# Patient Record
Sex: Female | Born: 1935 | Race: White | Hispanic: No | Marital: Single | Smoking: Never smoker
Health system: Southern US, Community
[De-identification: ages and names within clinical notes are randomized; demographics above are authoritative.]

## PROBLEM LIST (undated history)

## (undated) DIAGNOSIS — H409 Unspecified glaucoma: Secondary | ICD-10-CM

## (undated) DIAGNOSIS — F0390 Unspecified dementia without behavioral disturbance: Secondary | ICD-10-CM

## (undated) DIAGNOSIS — K219 Gastro-esophageal reflux disease without esophagitis: Secondary | ICD-10-CM

## (undated) DIAGNOSIS — H9190 Unspecified hearing loss, unspecified ear: Secondary | ICD-10-CM

## (undated) DIAGNOSIS — I2510 Atherosclerotic heart disease of native coronary artery without angina pectoris: Secondary | ICD-10-CM

## (undated) HISTORY — PX: PACEMAKER PLACEMENT: SHX43

---

## 2015-01-20 ENCOUNTER — Emergency Department (HOSPITAL_COMMUNITY)
Admission: EM | Admit: 2015-01-20 | Discharge: 2015-01-20 | Disposition: A | Discharge status: Home / Self Care | Payer: Medicare Other | Attending: Emergency Medicine | Admitting: Emergency Medicine

## 2015-01-20 ENCOUNTER — Encounter (HOSPITAL_COMMUNITY): Payer: Self-pay | Admitting: *Deleted

## 2015-01-20 ENCOUNTER — Emergency Department (HOSPITAL_COMMUNITY): Payer: Medicare Other

## 2015-01-20 DIAGNOSIS — W1839XA Other fall on same level, initial encounter: Secondary | ICD-10-CM | POA: Insufficient documentation

## 2015-01-20 DIAGNOSIS — Y998 Other external cause status: Secondary | ICD-10-CM | POA: Diagnosis not present

## 2015-01-20 DIAGNOSIS — Y9389 Activity, other specified: Secondary | ICD-10-CM | POA: Diagnosis not present

## 2015-01-20 DIAGNOSIS — W19XXXA Unspecified fall, initial encounter: Secondary | ICD-10-CM

## 2015-01-20 DIAGNOSIS — S29001A Unspecified injury of muscle and tendon of front wall of thorax, initial encounter: Secondary | ICD-10-CM | POA: Diagnosis not present

## 2015-01-20 DIAGNOSIS — F0390 Unspecified dementia without behavioral disturbance: Secondary | ICD-10-CM | POA: Insufficient documentation

## 2015-01-20 DIAGNOSIS — Y9289 Other specified places as the place of occurrence of the external cause: Secondary | ICD-10-CM | POA: Diagnosis not present

## 2015-01-20 DIAGNOSIS — S42002A Fracture of unspecified part of left clavicle, initial encounter for closed fracture: Secondary | ICD-10-CM

## 2015-01-20 DIAGNOSIS — I2510 Atherosclerotic heart disease of native coronary artery without angina pectoris: Secondary | ICD-10-CM | POA: Diagnosis not present

## 2015-01-20 DIAGNOSIS — H409 Unspecified glaucoma: Secondary | ICD-10-CM | POA: Insufficient documentation

## 2015-01-20 DIAGNOSIS — Z8719 Personal history of other diseases of the digestive system: Secondary | ICD-10-CM | POA: Diagnosis not present

## 2015-01-20 DIAGNOSIS — R0781 Pleurodynia: Secondary | ICD-10-CM

## 2015-01-20 DIAGNOSIS — S42032A Displaced fracture of lateral end of left clavicle, initial encounter for closed fracture: Secondary | ICD-10-CM | POA: Insufficient documentation

## 2015-01-20 HISTORY — DX: Unspecified hearing loss, unspecified ear: H91.90

## 2015-01-20 HISTORY — DX: Unspecified glaucoma: H40.9

## 2015-01-20 HISTORY — DX: Unspecified dementia, unspecified severity, without behavioral disturbance, psychotic disturbance, mood disturbance, and anxiety: F03.90

## 2015-01-20 HISTORY — DX: Atherosclerotic heart disease of native coronary artery without angina pectoris: I25.10

## 2015-01-20 HISTORY — DX: Gastro-esophageal reflux disease without esophagitis: K21.9

## 2015-01-20 MED ORDER — ACETAMINOPHEN 500 MG PO TABS
1000.0000 mg | ORAL_TABLET | Freq: Once | ORAL | Status: AC
  Administered 2015-01-20: 1000 mg via ORAL
  Filled 2015-01-20: qty 2

## 2015-01-20 NOTE — ED Provider Notes (Addendum)
CSN: [id number]     Arrival date & time 01/20/15  1759 History   First MD Initiated Contact with Patient 01/20/15 2057     Chief Complaint  Patient presents with  . Fall     (Consider location/radiation/quality/duration/timing/severity/associated sxs/prior Treatment) HPI Patient presents with a family member who provide history of present illness. Patient has a history of dementia, level V caveat. Patient minimally follows commands, is essentially nonverbal. Family member states patient unwitnessed fall today, seems to indicate associated has pain in her left lateral rib cage. Patient has frequent falls. Family member states the patient is otherwise interacting in a typical manner for her.  Past Medical History  Diagnosis Date  . Dementia   . Glaucoma   . Acid reflux   . Coronary artery disease   . HOH (hard of hearing)    Past Surgical History  Procedure Laterality Date  . Pacemaker placement     History reviewed. No pertinent family history. Social History  Substance Use Topics  . Smoking status: Never Smoker   . Smokeless tobacco: None  . Alcohol Use: No   OB History    No data available     Review of Systems  Unable to perform ROS: Dementia      Allergies  Sulfa antibiotics  Home Medications   Prior to Admission medications   Not on File   BP 196/103 mmHg  Pulse 95  Temp(Src) 98.9 F (37.2 C) (Oral)  Resp 21  SpO2 97% Physical Exam  Constitutional: She appears well-developed and well-nourished. No distress.  HENT:  Head: Normocephalic and atraumatic.  Eyes: Conjunctivae and EOM are normal.  Cardiovascular: Normal rate and regular rhythm.   Pulmonary/Chest: Effort normal and breath sounds normal. No stridor. No respiratory distress.      Abdominal: She exhibits no distension.  Musculoskeletal: She exhibits no edema.       Right shoulder: Normal.       Left shoulder: Normal.       Right hip: Normal.       Left hip: Normal.       Right  knee: Normal.       Left knee: Normal.  Neurological: She displays atrophy. She displays no tremor. No cranial nerve deficit. She displays no seizure activity.  Minimally interactive, but does move all extremities spontaneously  Skin: Skin is warm and dry.     Psychiatric: Her speech is delayed. Cognition and memory are impaired.  Nursing note and vitals reviewed.   ED Course  Procedures (including critical care time)  Imaging Review Dg Ribs Unilateral W/chest Right  01/20/2015   CLINICAL DATA:  Fall with right rib pain.  EXAM: RIGHT RIBS AND CHEST - 3+ VIEW  COMPARISON:  06/25/2014  FINDINGS: Upper limits normal heart size and left-sided pacemaker again noted.  Left basilar atelectasis/ scarring noted.  There is no evidence of pleural effusion, pneumothorax, pulmonary edema or mass.  No acute right rib fractures are identified.  A fracture of the distal left clavicle appear subacute to remote but new since 06/25/2014.  Remote left-sided rib fractures are identified.  IMPRESSION: No evidence of acute right rib abnormality.  Distal left clavicle fracture -appears subacute to remote but new since 06/25/2014.  Mild left basilar atelectasis/scarring.   Electronically Signed   By: Harmon Pier M.D.   On: 01/20/2015 22:18   I have personally reviewed and evaluated these images and lab results as part of my medical decision-making.  I demonstrated the images  to the patient's husband, we discussed the subacute clavicle fracture, as well as today's absence of rib fractures.  Husband states patient had a fall approximately 3 weeks ago, and the trace ecchymosis on her left superior lateral shoulder resulted during that episode.  MDM  Elderly female presents after a fall with mild rib pain. Patient is interacting at baseline according to her husband, here is in no distress. No x-ray evidence for acute fracture, though there is demonstration of subacute medical fracture. Patient had Tylenol provided  here, patient's husband will provide some more medication for pain management home, patient discharged in stable condition.  Gerhard Munch, MD 01/20/15 1610  Gerhard Munch, MD 01/20/15 [phone]

## 2015-01-20 NOTE — ED Notes (Signed)
Family reports pt had unwitnessed fall today, was found in bedroom and unsure if she hit her head. No obv injuries noted. Not on thinners. No acute distress noted at triage.

## 2016-12-11 DEATH — deceased

## 2017-02-07 IMAGING — DX DG RIBS W/ CHEST 3+V*R*
5 series · 5 of 5 positions shown · non-contrast
Comparison: 06/25/2014

CLINICAL DATA: Fall with right rib pain.

EXAM:
RIGHT RIBS AND CHEST - 3+ VIEW

[chest ap]
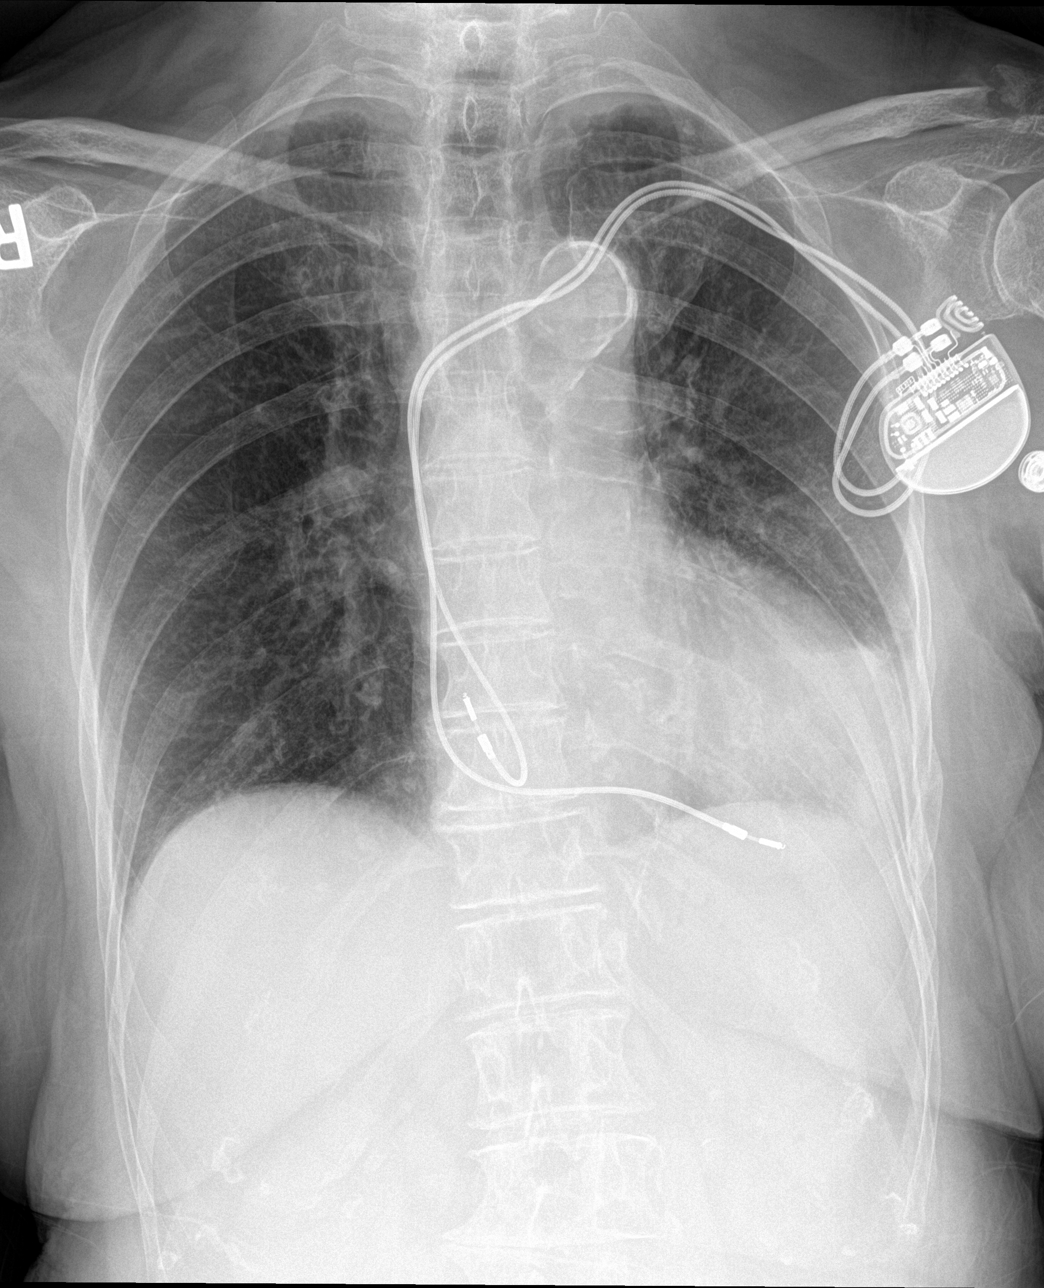

[rib ap (1 of 2)]
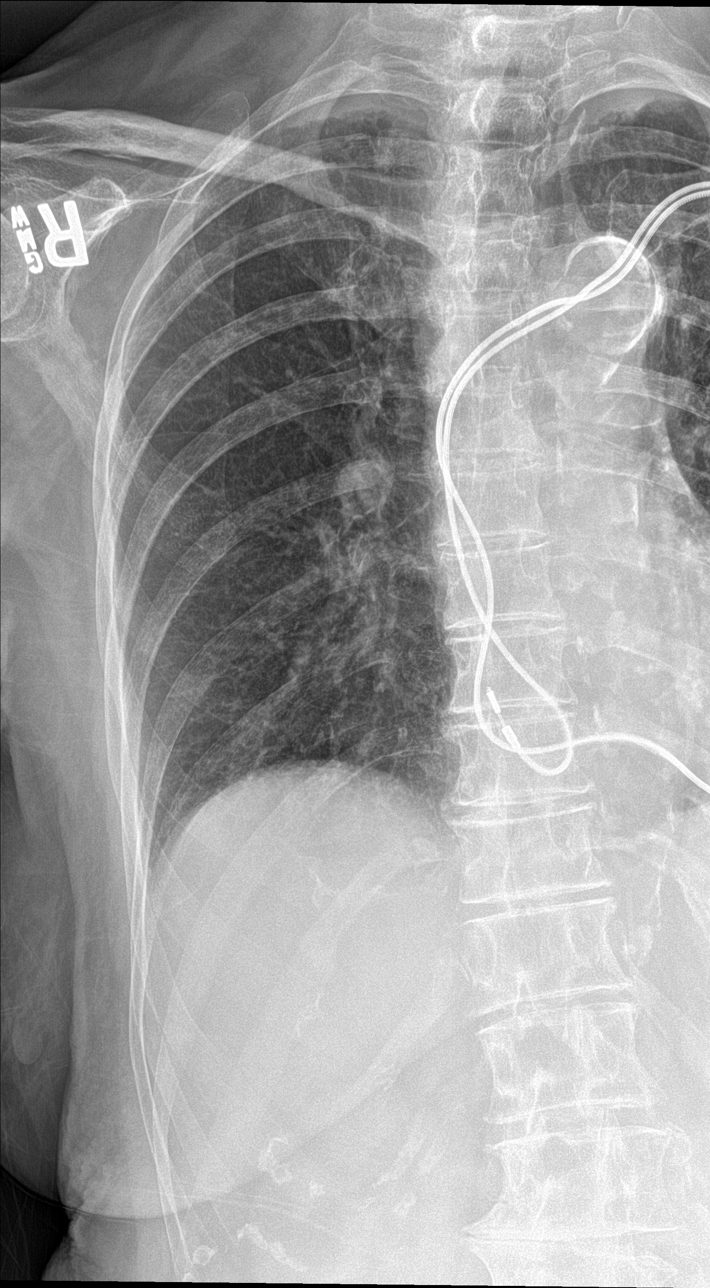

[rib ap obl (1 of 2)]
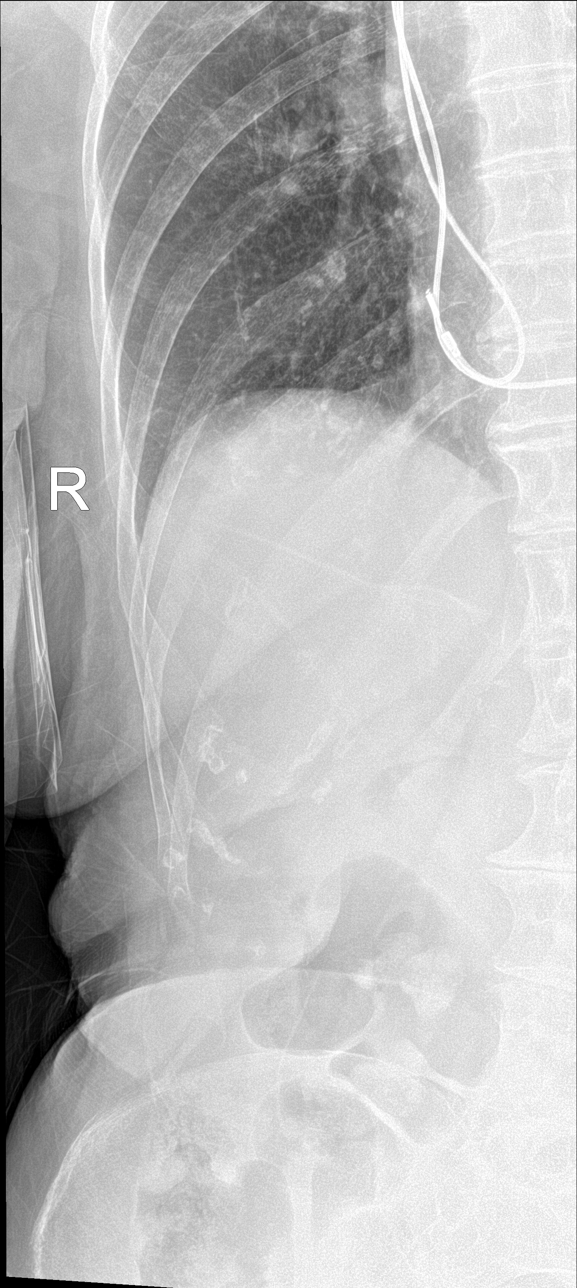

[rib ap (2 of 2)]
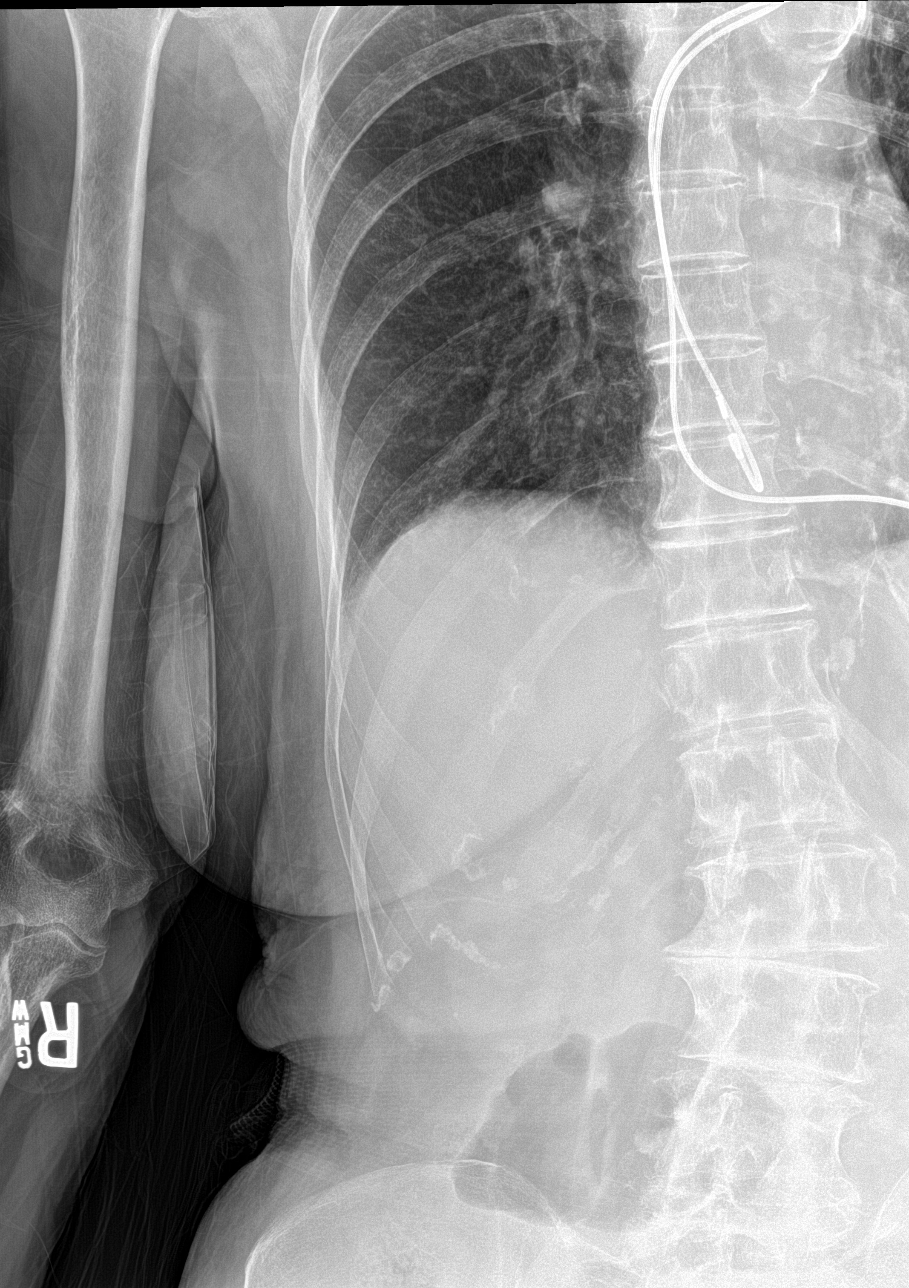

[rib ap obl (2 of 2)]
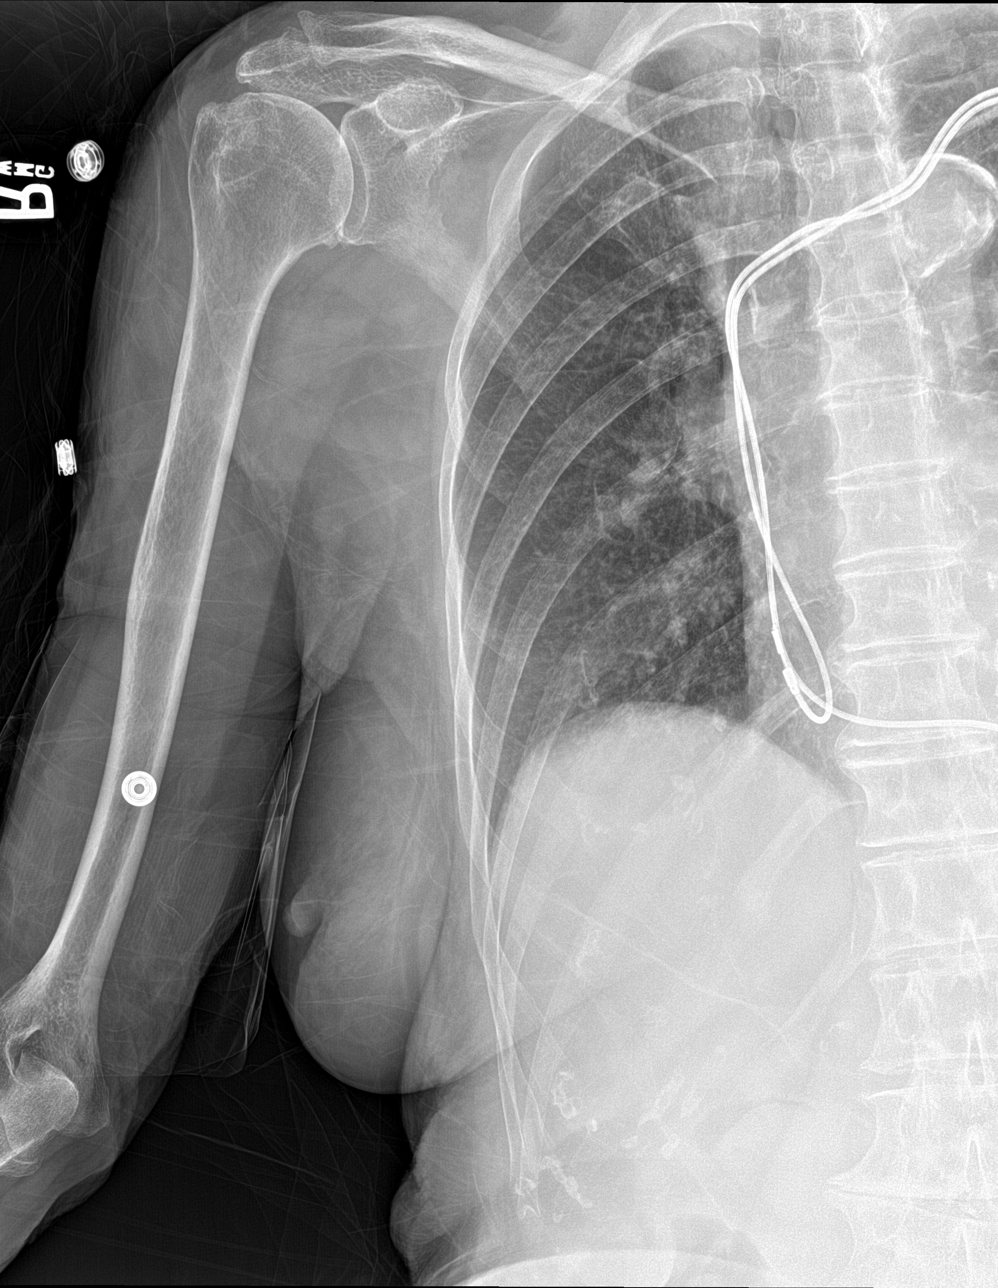

[5 of 5 positions shown; findings below may reference images not displayed]

FINDINGS: Upper limits normal heart size and left-sided pacemaker again noted.

Left basilar atelectasis/ scarring noted.

There is no evidence of pleural effusion, pneumothorax, pulmonary
edema or mass.

No acute right rib fractures are identified.

A fracture of the distal left clavicle appear subacute to remote but
new since 06/25/2014.

Remote left-sided rib fractures are identified.
IMPRESSION: No evidence of acute right rib abnormality.

Distal left clavicle fracture -appears subacute to remote but new
since 06/25/2014.

Mild left basilar atelectasis/scarring.
